# Patient Record
Sex: Female | Born: 1940
Health system: Southern US, Community
[De-identification: ages and names within clinical notes are randomized; demographics above are authoritative.]

## PROBLEM LIST (undated history)

## (undated) DIAGNOSIS — K635 Polyp of colon: Secondary | ICD-10-CM

## (undated) DIAGNOSIS — H269 Unspecified cataract: Secondary | ICD-10-CM

## (undated) DIAGNOSIS — I10 Essential (primary) hypertension: Secondary | ICD-10-CM

## (undated) DIAGNOSIS — M1990 Unspecified osteoarthritis, unspecified site: Secondary | ICD-10-CM

## (undated) DIAGNOSIS — H35419 Lattice degeneration of retina, unspecified eye: Secondary | ICD-10-CM

## (undated) DIAGNOSIS — E7800 Pure hypercholesterolemia, unspecified: Secondary | ICD-10-CM

## (undated) HISTORY — PX: TOE SURGERY: SHX1073

## (undated) HISTORY — PX: CARPAL TUNNEL RELEASE: SHX101

## (undated) HISTORY — PX: TYMPANOSTOMY TUBE PLACEMENT: SHX32

## (undated) HISTORY — PX: CATARACT EXTRACTION: SUR2

## (undated) HISTORY — PX: BREAST SURGERY: SHX581

## (undated) HISTORY — PX: ABDOMINAL HYSTERECTOMY: SHX81

## (undated) HISTORY — PX: COLONOSCOPY: SHX174

---

## 2017-09-24 DEATH — deceased

## 2019-01-31 ENCOUNTER — Encounter (HOSPITAL_BASED_OUTPATIENT_CLINIC_OR_DEPARTMENT_OTHER): Payer: Self-pay | Admitting: Emergency Medicine

## 2019-01-31 ENCOUNTER — Other Ambulatory Visit: Payer: Self-pay

## 2019-01-31 ENCOUNTER — Emergency Department (HOSPITAL_BASED_OUTPATIENT_CLINIC_OR_DEPARTMENT_OTHER): Payer: Medicare Other

## 2019-01-31 ENCOUNTER — Emergency Department (HOSPITAL_BASED_OUTPATIENT_CLINIC_OR_DEPARTMENT_OTHER)
Admission: EM | Admit: 2019-01-31 | Discharge: 2019-01-31 | Disposition: A | Discharge status: Home / Self Care | Payer: Medicare Other | Attending: Emergency Medicine | Admitting: Emergency Medicine

## 2019-01-31 DIAGNOSIS — M25512 Pain in left shoulder: Secondary | ICD-10-CM | POA: Insufficient documentation

## 2019-01-31 DIAGNOSIS — M25511 Pain in right shoulder: Secondary | ICD-10-CM | POA: Diagnosis not present

## 2019-01-31 DIAGNOSIS — I10 Essential (primary) hypertension: Secondary | ICD-10-CM | POA: Insufficient documentation

## 2019-01-31 DIAGNOSIS — M25562 Pain in left knee: Secondary | ICD-10-CM | POA: Insufficient documentation

## 2019-01-31 HISTORY — DX: Unspecified osteoarthritis, unspecified site: M19.90

## 2019-01-31 HISTORY — DX: Lattice degeneration of retina, unspecified eye: H35.419

## 2019-01-31 HISTORY — DX: Unspecified cataract: H26.9

## 2019-01-31 HISTORY — DX: Essential (primary) hypertension: I10

## 2019-01-31 HISTORY — DX: Pure hypercholesterolemia, unspecified: E78.00

## 2019-01-31 HISTORY — DX: Polyp of colon: K63.5

## 2019-01-31 MED ORDER — LIDOCAINE 5 % EX OINT: 1.0000 "application " | TOPICAL_OINTMENT | Freq: Three times a day (TID) | CUTANEOUS | 0 refills | Status: AC | PRN

## 2019-01-31 MED FILL — LIDOCAINE 5 % OINT: 5 | 30 days supply | Qty: 35 | Fill #0

## 2019-01-31 NOTE — Discharge Instructions (Signed)
Your workup was reassuring today that you do not have a bony injury or dislocation to your knee.  We recommend that you use the provided Ace wrap but you can continue to bear weight as tolerated.  Read through the included information about routine care for injuries.  Follow up as recommended if you are still having problems in about a week.

## 2019-01-31 NOTE — ED Notes (Signed)
ED Provider at bedside. 

## 2019-01-31 NOTE — ED Triage Notes (Addendum)
Pain and swelling to left knee since last week.  Made appt with pmd for this Thursday but sts "I can't wait til Thursday."  Gets injections in her knee for arthritis.  No new injury.   Also c/o pain in both shoulders.

## 2019-01-31 NOTE — ED Provider Notes (Signed)
Emergency Department Provider Note   I have reviewed the triage vital signs and the nursing notes.   HISTORY  Chief Complaint Knee Pain   HPI Jennifer Matthews is a 78 y.o. female with PMH of HTN and HLD presents to the emergency department with left knee pain.  Patient has had pain in both knees chronically and occasionally gets injections in the knee for arthritis.  She has an appointment later this week for injection of the left knee but notes pain and swelling there.  She denies any fevers, chills, joint redness.  Pain is worse with movement or walking.  She denies pain in the left hip or ankle.  She does have some pain in the shoulders which is more chronic.    Past Medical History:  Diagnosis Date  . Arthritis   . Cataracts, bilateral   . Colon polyp   . High cholesterol   . Hypertension   . Retinal lattice degeneration     There are no active problems to display for this patient.   Past Surgical History:  Procedure Laterality Date  . ABDOMINAL HYSTERECTOMY    . BREAST SURGERY    . CARPAL TUNNEL RELEASE    . CATARACT EXTRACTION    . COLONOSCOPY    . TOE SURGERY    . TYMPANOSTOMY TUBE PLACEMENT     Allergies Patient has no known allergies.  No family history on file.  Social History Social History   Tobacco Use  . Smoking status: Never Smoker  . Smokeless tobacco: Never Used  Substance Use Topics  . Alcohol use: Never    Frequency: Never  . Drug use: Never    Review of Systems  Constitutional: No fever/chills Eyes: No visual changes. ENT: No sore throat. Cardiovascular: Denies chest pain. Respiratory: Denies shortness of breath. Gastrointestinal: No abdominal pain.  No nausea, no vomiting.  No diarrhea.  No constipation. Genitourinary: Negative for dysuria. Musculoskeletal: Negative for back pain. Left knee pain and swelling.  Skin: Negative for rash. Neurological: Negative for headaches, focal weakness or numbness.  10-point ROS otherwise  negative.  ____________________________________________   PHYSICAL EXAM:  VITAL SIGNS: ED Triage Vitals [01/31/19 0920]  Enc Vitals Group     BP (!) 123/58     Pulse Rate 84     Resp 16     Temp 98.6 F (37 C)     Temp Source Oral     SpO2 100 %     Weight 150 lb (68 kg)     Height 5' (1.524 m)   Constitutional: Alert and oriented. Well appearing and in no acute distress. Eyes: Conjunctivae are normal.  Head: Atraumatic. Nose: No congestion/rhinnorhea. Mouth/Throat: Mucous membranes are moist.  Neck: No stridor.  Cardiovascular: Good peripheral circulation. Respiratory: Normal respiratory effort.  Gastrointestinal: No distention.  Musculoskeletal: Left knee effusion without erythema or warmth. No ankle or hip discomfort or decreased ROM.  Neurologic:  Normal speech and language. No gross focal neurologic deficits are appreciated.  Skin:  Skin is warm, dry and intact. No rash noted.  ____________________________________________  RADIOLOGY  Dg Knee Complete 4 Views Left  Result Date: 01/31/2019 CLINICAL DATA:  Medial left knee pain for 1 week.  No known injury. EXAM: LEFT KNEE - COMPLETE 4+ VIEW COMPARISON:  None. FINDINGS: There is no acute bony or joint abnormality. Joint spaces are preserved. Osteophytosis about the knee is most notable about the lateral and patellofemoral compartments. No chondrocalcinosis. Small joint effusion noted. IMPRESSION: No  acute abnormality. Mild osteoarthritis. Electronically Signed   By: Drusilla Kanner M.D.   On: 01/31/2019 10:19    ____________________________________________   PROCEDURES  Procedure(s) performed:   Procedures  None ____________________________________________   INITIAL IMPRESSION / ASSESSMENT AND PLAN / ED COURSE  Pertinent labs & imaging results that were available during my care of the patient were reviewed by me and considered in my medical decision making (see chart for details).  Patient presents to the  emergency department with left knee pain.  She does have an effusion on exam.  There is no increased warmth or redness of the joint to suspect septic arthritis.  She allows me to range the knee without severe pain.  Plan for plain film to rule out bony abnormality but lower suspicion for this.  Patient has an appointment for knee aspiration and steroid injection.  Plan at this appointment and will apply Ace wrap to the knee and assist with additional pain medications for home.  Plain film negative. Plan for ortho f/u as scheduled later this week.  ____________________________________________  FINAL CLINICAL IMPRESSION(S) / ED DIAGNOSES  Final diagnoses:  Acute pain of left knee    NEW OUTPATIENT MEDICATIONS STARTED DURING THIS VISIT:  Discharge Medication List as of 01/31/2019 10:54 AM    START taking these medications   Details  lidocaine (XYLOCAINE) 5 % ointment Apply 1 application topically 3 (three) times daily as needed., Starting Mon 01/31/2019, Print        Note:  This document was prepared using Dragon voice recognition software and may include unintentional dictation errors.  Alona Bene, MD Emergency Medicine    Sylver Vantassell, Arlyss Repress, MD 01/31/19 2045

## 2020-01-13 IMAGING — DX LEFT KNEE - COMPLETE 4+ VIEW
4 series · 4 of 4 positions shown · non-contrast
Comparison: None.

CLINICAL DATA: Medial left knee pain for 1 week.  No known injury.

EXAM:
LEFT KNEE - COMPLETE 4+ VIEW

[knee ap]
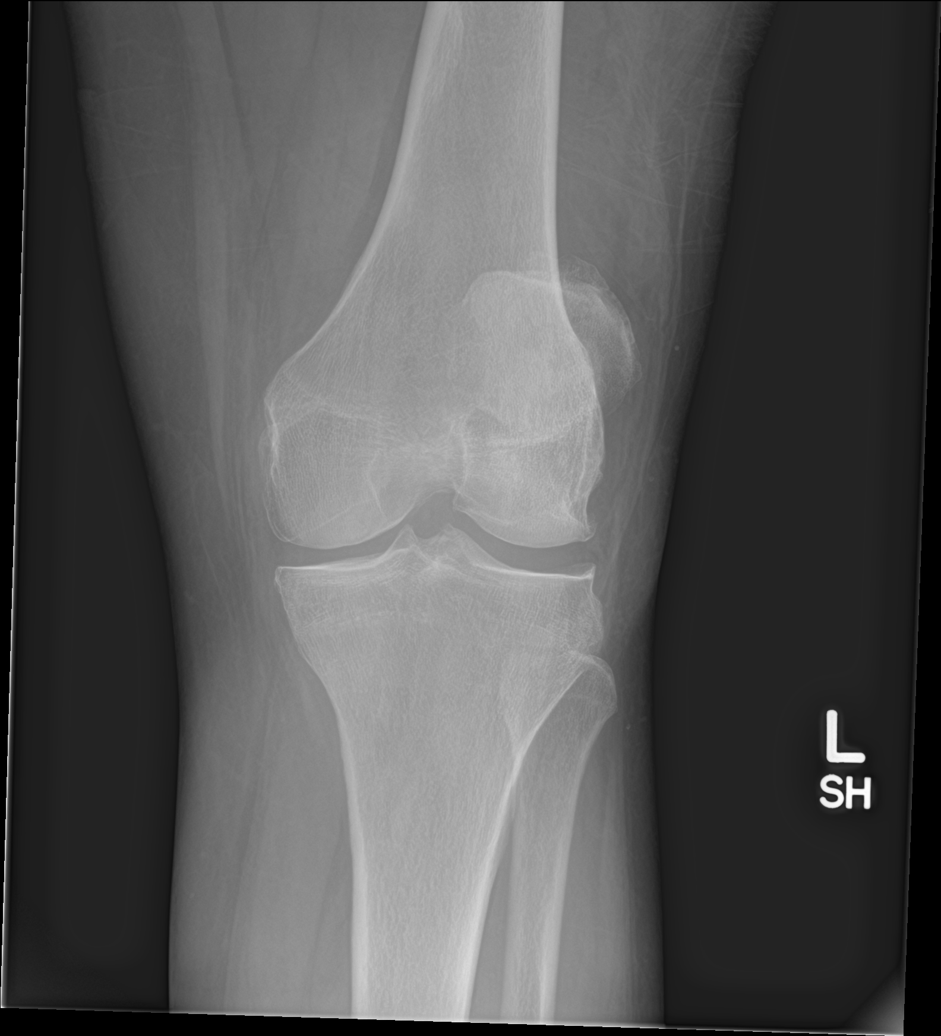

[knee lat]
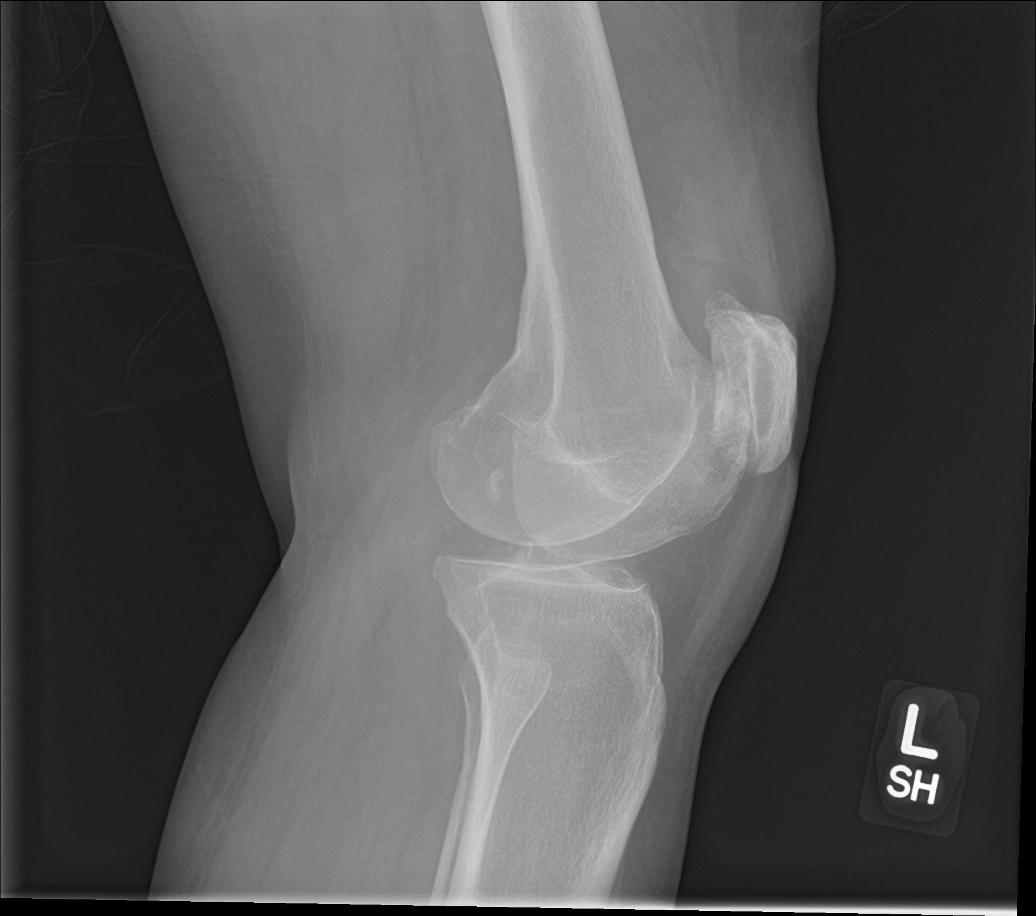

[knee obl (1 of 2)]
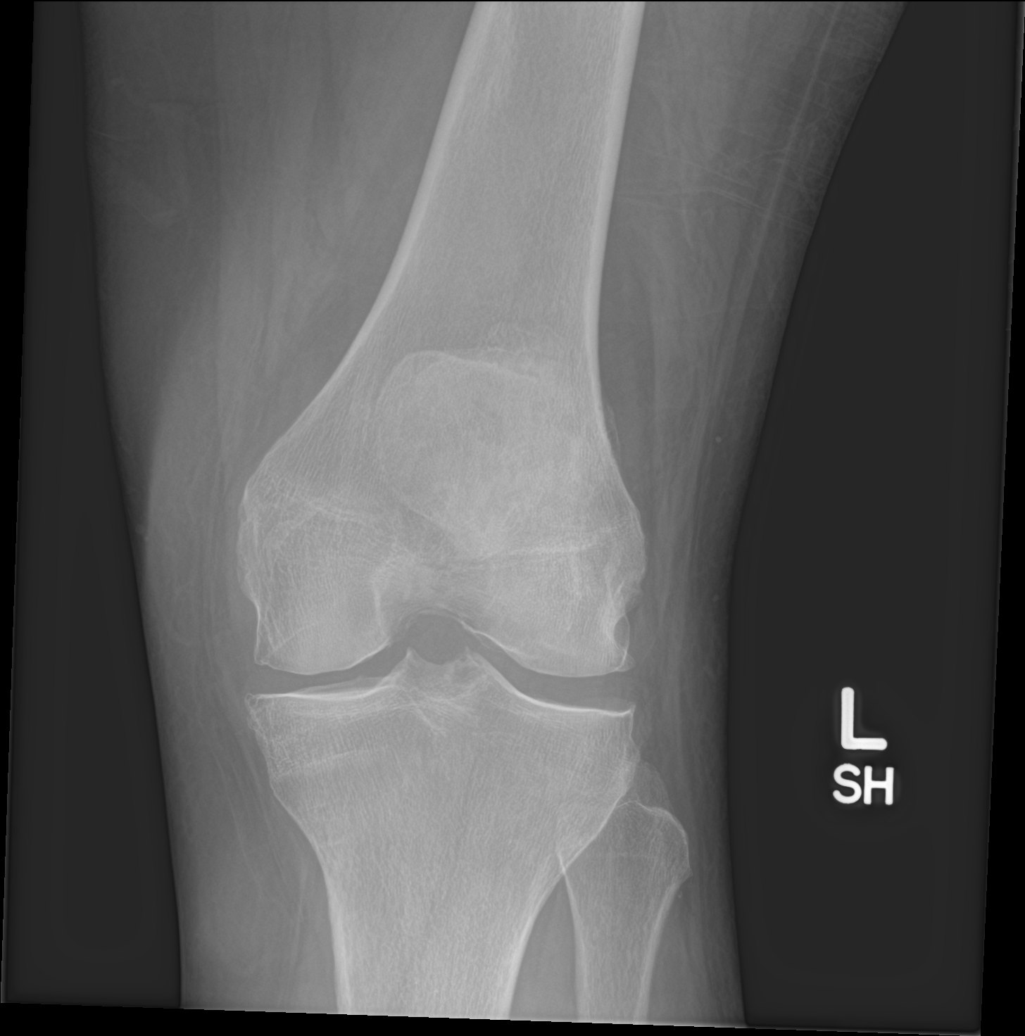

[knee obl (2 of 2)]
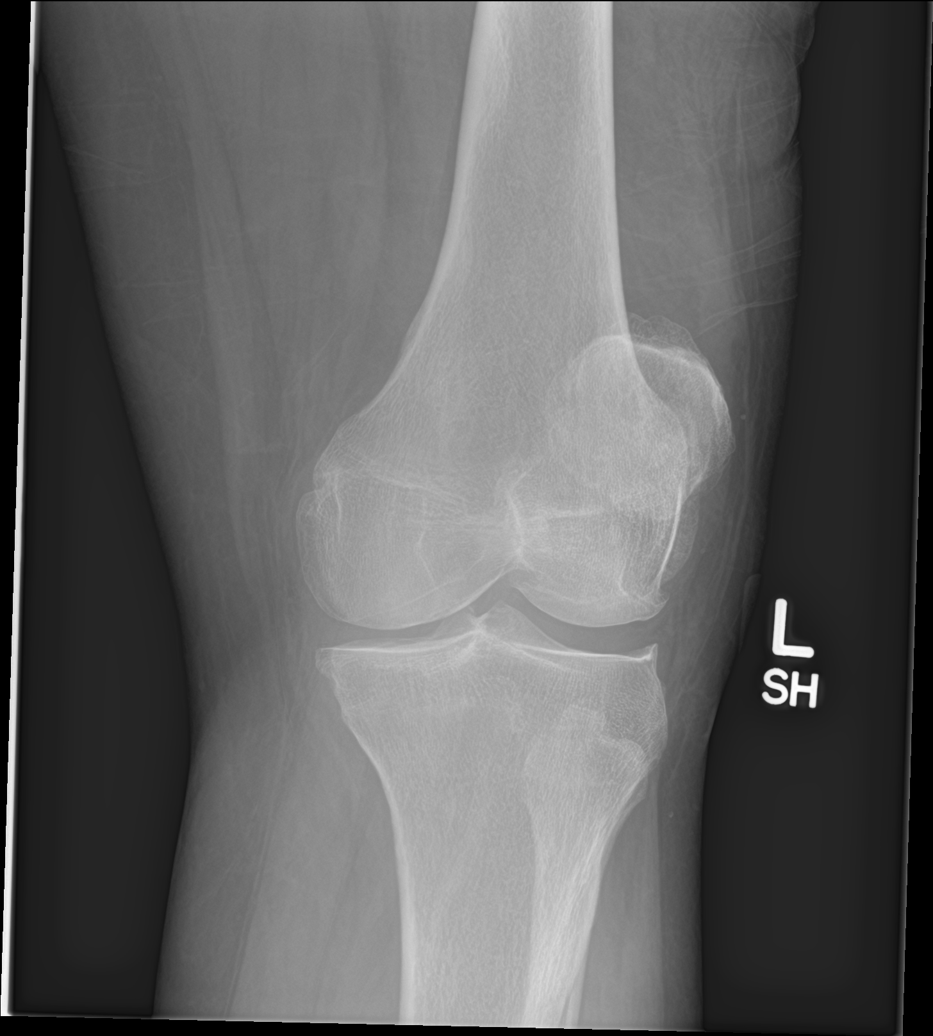

[4 of 4 positions shown; findings below may reference images not displayed]

FINDINGS: There is no acute bony or joint abnormality. Joint spaces are
preserved. Osteophytosis about the knee is most notable about the
lateral and patellofemoral compartments. No chondrocalcinosis. Small
joint effusion noted.
IMPRESSION: No acute abnormality.

Mild osteoarthritis.
